# Patient Record
Sex: Male | Born: 1965 | Race: Black or African American | Hispanic: No | Marital: Married | Smoking: Never smoker
Health system: Southern US, Community
[De-identification: ages and names within clinical notes are randomized; demographics above are authoritative.]

## PROBLEM LIST (undated history)

## (undated) DIAGNOSIS — M109 Gout, unspecified: Secondary | ICD-10-CM

## (undated) DIAGNOSIS — I10 Essential (primary) hypertension: Secondary | ICD-10-CM

## (undated) HISTORY — PX: ARCUATE KERATECTOMY: SHX212

## (undated) HISTORY — PX: NECK SURGERY: SHX720

## (undated) HISTORY — PX: BACK SURGERY: SHX140

## (undated) HISTORY — PX: HERNIA REPAIR: SHX51

## (undated) HISTORY — PX: FRACTURE SURGERY: SHX138

---

## 2000-10-25 ENCOUNTER — Encounter: Admission: RE | Admit: 2000-10-25 | Discharge: 2000-10-25 | Payer: Self-pay | Admitting: Neurosurgery

## 2000-10-25 ENCOUNTER — Encounter: Payer: Self-pay | Admitting: Neurosurgery

## 2004-12-01 ENCOUNTER — Ambulatory Visit (HOSPITAL_COMMUNITY): Admission: RE | Admit: 2004-12-01 | Discharge: 2004-12-02 | Payer: Self-pay | Admitting: Neurosurgery

## 2005-01-02 ENCOUNTER — Encounter: Admission: RE | Admit: 2005-01-02 | Discharge: 2005-01-02 | Payer: Self-pay | Admitting: Neurosurgery

## 2005-02-20 ENCOUNTER — Encounter: Admission: RE | Admit: 2005-02-20 | Discharge: 2005-02-20 | Payer: Self-pay | Admitting: Neurosurgery

## 2005-04-06 ENCOUNTER — Encounter: Admission: RE | Admit: 2005-04-06 | Discharge: 2005-04-06 | Payer: Self-pay | Admitting: Neurosurgery

## 2005-09-21 ENCOUNTER — Encounter: Admission: RE | Admit: 2005-09-21 | Discharge: 2005-09-21 | Payer: Self-pay | Admitting: Neurosurgery

## 2006-06-06 ENCOUNTER — Encounter: Admission: RE | Admit: 2006-06-06 | Discharge: 2006-07-30 | Payer: Self-pay | Admitting: Neurosurgery

## 2012-08-24 ENCOUNTER — Emergency Department (HOSPITAL_BASED_OUTPATIENT_CLINIC_OR_DEPARTMENT_OTHER)
Admission: EM | Admit: 2012-08-24 | Discharge: 2012-08-24 | Disposition: A | Discharge status: Home / Self Care | Payer: Self-pay | Attending: Emergency Medicine | Admitting: Emergency Medicine

## 2012-08-24 ENCOUNTER — Encounter (HOSPITAL_BASED_OUTPATIENT_CLINIC_OR_DEPARTMENT_OTHER): Payer: Self-pay | Admitting: *Deleted

## 2012-08-24 DIAGNOSIS — M5430 Sciatica, unspecified side: Secondary | ICD-10-CM | POA: Insufficient documentation

## 2012-08-24 DIAGNOSIS — M25559 Pain in unspecified hip: Secondary | ICD-10-CM | POA: Insufficient documentation

## 2012-08-24 DIAGNOSIS — R209 Unspecified disturbances of skin sensation: Secondary | ICD-10-CM | POA: Insufficient documentation

## 2012-08-24 DIAGNOSIS — I10 Essential (primary) hypertension: Secondary | ICD-10-CM | POA: Insufficient documentation

## 2012-08-24 DIAGNOSIS — Z79899 Other long term (current) drug therapy: Secondary | ICD-10-CM | POA: Insufficient documentation

## 2012-08-24 DIAGNOSIS — M5432 Sciatica, left side: Secondary | ICD-10-CM

## 2012-08-24 DIAGNOSIS — M62838 Other muscle spasm: Secondary | ICD-10-CM | POA: Insufficient documentation

## 2012-08-24 DIAGNOSIS — Z8781 Personal history of (healed) traumatic fracture: Secondary | ICD-10-CM | POA: Insufficient documentation

## 2012-08-24 HISTORY — DX: Essential (primary) hypertension: I10

## 2012-08-24 MED ORDER — NAPROXEN 250 MG PO TABS
500.0000 mg | ORAL_TABLET | Freq: Once | ORAL | Status: AC
  Administered 2012-08-24: 500 mg via ORAL
  Filled 2012-08-24: qty 2

## 2012-08-24 MED ORDER — NAPROXEN SODIUM 220 MG PO TABS: ORAL_TABLET | ORAL | Status: DC

## 2012-08-24 MED ORDER — OXYCODONE-ACETAMINOPHEN 5-325 MG PO TABS: 1.0000 | ORAL_TABLET | Freq: Four times a day (QID) | ORAL | Status: DC | PRN

## 2012-08-24 NOTE — ED Provider Notes (Signed)
History    This chart was scribed for Hanley Seamen, MD by Quintella Reichert, ED scribe.  This patient was seen in room MH05/MH05 and the patient's care was started at 11:24 PM.   CSN: [id number]  Arrival date & time 08/24/12  2038      Chief Complaint  Patient presents with  . Leg Pain     The history is provided by the patient. No language interpreter was used.    HPI Comments: Reginald Flores is a 47 y.o. male who presents to the Emergency Department complaining of constant, waxing-and-waning left-sided hip pain in the SI region that began 2 weeks ago.  The pain is characterized as being "like something piercing into my bone," and radiating into the left-sided groin and down the left leg, through about the L4 or L5 dermatome.  Pt also notes intermittent numbness and intermittent muscle spasms in the region.  Pt is ambulatory but with pain.  He notes that he has tried some OTC analgesics, with partial relief.  Pt denies groin numbness, urinary or bowel symptoms, nausea, emesis, or any other associated symptoms.  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 Severity is moderate to severe.   Past Medical History  Diagnosis Date  . Hypertension     Past Surgical History  Procedure Laterality Date  . Hernia repair    . Fracture surgery    . Back surgery      History reviewed. No pertinent family history.  History  Substance Use Topics  . Smoking status: Never Smoker   . Smokeless tobacco: Not on file  . Alcohol Use: No      Review of Systems A complete 10 system review of systems was obtained and all systems are negative except as noted in the HPI and PMH.    Allergies  Review of patient's allergies indicates no known allergies.  Home Medications   Current Outpatient Rx  Name  Route  Sig  Dispense  Refill  . hydrochlorothiazide (HYDRODIURIL) 25 MG tablet   Oral   Take 25 mg by mouth daily.           BP 141/82  Pulse 68   Temp(Src) 99.4 F (37.4 C) (Oral)  Resp 16  Ht 5' 5.5" (1.664 m)  Wt 168 lb (76.204 kg)  BMI 27.52 kg/m2  SpO2 100%  Physical Exam  Nursing note and vitals reviewed. General: Well-developed, well-nourished male in no acute distress; appearance consistent with age of record HENT: Atraumatic.  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 Eyes: pupils equal round and reactive to light; extraocular muscles intact Neck: supple Heart: regular rate and rhythm; no murmurs, rubs or gallops Lungs: clear to auscultation bilaterally Abdomen: soft; nondistended; nontender; no masses or hepatosplenomegaly; bowel sounds present Extremities: No deformity; full range of motion; pulses normal Minimal left sciatic notch tenderness No back tenderness Normal right straight leg raise Left straight leg raise pain on 45 degrees Neurologic: Awake, alert and oriented; motor function intact in all extremities and symmetric; no facial droop Skin: Warm and dry Psychiatric: Normal mood and affect     ED Course  Procedures (including critical care time)  DIAGNOSTIC STUDIES: Oxygen Saturation is 100% on room air, normal by my interpretation.    COORDINATION OF CARE: 11:29 PM-Informed pt that symptoms are likely due to sciatica.  Discussed treatment plan which includes pain medication and f/u with neurologist with pt at  bedside and pt agreed to plan.      MDM  I personally performed the services described in this documentation, which was scribed in my presence.  The recorded information has been reviewed and is accurate.       Hanley Seamen, MD 08/25/12 [phone]

## 2012-08-24 NOTE — ED Notes (Signed)
Left leg pain and "spasms" x 2 weeks

## 2012-08-24 NOTE — ED Notes (Signed)
MD at bedside. 

## 2012-08-24 NOTE — ED Notes (Signed)
Called x 1 for triage. Not in lobby restroom.

## 2012-09-11 ENCOUNTER — Emergency Department (HOSPITAL_BASED_OUTPATIENT_CLINIC_OR_DEPARTMENT_OTHER)
Admission: EM | Admit: 2012-09-11 | Discharge: 2012-09-11 | Disposition: A | Discharge status: Home / Self Care | Payer: Self-pay | Attending: Emergency Medicine | Admitting: Emergency Medicine

## 2012-09-11 ENCOUNTER — Encounter (HOSPITAL_BASED_OUTPATIENT_CLINIC_OR_DEPARTMENT_OTHER): Payer: Self-pay | Admitting: *Deleted

## 2012-09-11 DIAGNOSIS — I10 Essential (primary) hypertension: Secondary | ICD-10-CM | POA: Insufficient documentation

## 2012-09-11 DIAGNOSIS — K5641 Fecal impaction: Secondary | ICD-10-CM | POA: Insufficient documentation

## 2012-09-11 DIAGNOSIS — K6289 Other specified diseases of anus and rectum: Secondary | ICD-10-CM | POA: Insufficient documentation

## 2012-09-11 NOTE — ED Provider Notes (Signed)
History     CSN: [id number]  Arrival date & time 09/11/12  0865   First MD Initiated Contact with Patient 09/11/12 [phone]      Chief Complaint  Patient presents with  . Constipation    (Consider location/radiation/quality/duration/timing/severity/associated sxs/prior treatment) HPI Patient reports feeling constipated for 4 days. He feels a fullness in his rectum. He treated himself with an enema yesterday with success at having a bowel movement. He can felt constipated today used an enema this morning without success. He denies abdominal pain denies fever denies nausea vomiting denies other complaint. Patient recently treated with Percocet for back pain. Back pain has resolved. No other associated symptoms. Past Medical History  Diagnosis Date  . Hypertension     Past Surgical History  Procedure Laterality Date  . Hernia repair    . Fracture surgery    . Back surgery      History reviewed. No pertinent family history.  History  Substance Use Topics  . Smoking status: Never Smoker   . Smokeless tobacco: Not on file  . Alcohol Use: No      Review of Systems  Constitutional: Negative.   Gastrointestinal: Positive for constipation and rectal pain. Negative for abdominal pain, blood in stool and abdominal distention.       Rectal fullness    Allergies  Review of patient's allergies indicates no known allergies.  Home Medications   Current Outpatient Rx  Name  Route  Sig  Dispense  Refill  . hydrochlorothiazide (HYDRODIURIL) 25 MG tablet   Oral   Take 25 mg by mouth daily.         . naproxen sodium (ALEVE) 220 MG tablet      Take 2 tablets every 12 hours for back pain.         Marland Kitchen oxyCODONE-acetaminophen (PERCOCET) 5-325 MG per tablet   Oral   Take 1-2 tablets by mouth every 6 (six) hours as needed for pain.   30 tablet   0     BP 155/91  Pulse 86  Temp(Src) 98.6 F (37 C)  Resp 18  Ht 5' 5.5" (1.664 m)  Wt 170 lb (77.111 kg)  BMI 27.85 kg/m2   SpO2 100%  Physical Exam  Nursing note and vitals reviewed. Constitutional: He appears well-developed and well-nourished.  HENT:  Head: Normocephalic and atraumatic.  Eyes: Conjunctivae are normal. Pupils are equal, round, and reactive to light.  Neck: Neck supple. No tracheal deviation present. No thyromegaly present.  Cardiovascular: Normal rate and regular rhythm.   No murmur heard. Pulmonary/Chest: Effort normal and breath sounds normal.  Abdominal: Soft. Bowel sounds are normal. He exhibits no distension. There is no tenderness.  Genitourinary: Penis normal.  Rectum normal tone hard brown stool in vault no gross blood  Musculoskeletal: Normal range of motion. He exhibits no edema and no tenderness.  Neurological: He is alert. Coordination normal.  Skin: Skin is warm and dry. No rash noted.  Psychiatric: He has a normal mood and affect.    ED Course  Procedures (including critical care time)  Labs Reviewed - No data to display No results found.   No diagnosis found.  I loosened stool in rectal vault and performed digital disimpaction. Patient had successful bowel movement after disimpaction. At 10:50 AM he is asymptomatic  MDM  Plan followup with PMD as needed. Blood pressure recheck 3 weeks Diagnosis#1 fecal impaction #2 hypertension       Doug Sou, MD 09/11/12 [phone]

## 2012-09-11 NOTE — ED Notes (Signed)
Pt reports constipation since yesterday.  Reports that he hasnt had a normal BM in 4 days.  Pt used an enema yesterday with success.  States that he feels a lot of pressure as if he needs to use the bathroom but is unable.  Denies rectal bleeding.

## 2013-12-18 ENCOUNTER — Emergency Department (HOSPITAL_BASED_OUTPATIENT_CLINIC_OR_DEPARTMENT_OTHER): Payer: No Typology Code available for payment source

## 2013-12-18 ENCOUNTER — Encounter (HOSPITAL_BASED_OUTPATIENT_CLINIC_OR_DEPARTMENT_OTHER): Payer: Self-pay | Admitting: Emergency Medicine

## 2013-12-18 ENCOUNTER — Emergency Department (HOSPITAL_BASED_OUTPATIENT_CLINIC_OR_DEPARTMENT_OTHER)
Admission: EM | Admit: 2013-12-18 | Discharge: 2013-12-18 | Disposition: A | Discharge status: Home / Self Care | Payer: No Typology Code available for payment source | Attending: Emergency Medicine | Admitting: Emergency Medicine

## 2013-12-18 DIAGNOSIS — R209 Unspecified disturbances of skin sensation: Secondary | ICD-10-CM | POA: Diagnosis present

## 2013-12-18 DIAGNOSIS — Z791 Long term (current) use of non-steroidal anti-inflammatories (NSAID): Secondary | ICD-10-CM | POA: Insufficient documentation

## 2013-12-18 DIAGNOSIS — Z79899 Other long term (current) drug therapy: Secondary | ICD-10-CM | POA: Insufficient documentation

## 2013-12-18 DIAGNOSIS — I10 Essential (primary) hypertension: Secondary | ICD-10-CM | POA: Diagnosis not present

## 2013-12-18 DIAGNOSIS — M5412 Radiculopathy, cervical region: Secondary | ICD-10-CM | POA: Diagnosis not present

## 2013-12-18 DIAGNOSIS — IMO0002 Reserved for concepts with insufficient information to code with codable children: Secondary | ICD-10-CM | POA: Diagnosis not present

## 2013-12-18 DIAGNOSIS — M109 Gout, unspecified: Secondary | ICD-10-CM | POA: Diagnosis not present

## 2013-12-18 HISTORY — DX: Gout, unspecified: M10.9

## 2013-12-18 MED ORDER — HYDROCODONE-ACETAMINOPHEN 5-325 MG PO TABS: 1.0000 | ORAL_TABLET | ORAL | Status: DC | PRN

## 2013-12-18 MED ORDER — PREDNISONE (PAK) 10 MG PO TABS: ORAL_TABLET | ORAL | Status: DC

## 2013-12-18 NOTE — ED Provider Notes (Signed)
CSN: [id number]     Arrival date & time 12/18/13  1621 History   First MD Initiated Contact with Patient 12/18/13 1632     Chief Complaint  Patient presents with  . Numbness    HPI Patient presents emergent complaints of intermittent numbness and tingling into his left arm for the last 3 weeks. Patient states he's had some sharp pain in his left shoulder down towards his hand. He is also noticed some numbness and tingling in the arm and hand primarily in his fifth fourth and third digits. He does work with his arms a lot. He has not noticed any trouble with weakness or numbness in his legs. He has not had any difficulty with his speech. He denies headache. He denies trouble with his coordination.  No facial numbness or weakness. Past Medical History  Diagnosis Date  . Hypertension   . Gout    Past Surgical History  Procedure Laterality Date  . Hernia repair    . Fracture surgery    . Back surgery    . Neck surgery    . Arcuate keratectomy     No family history on file. History  Substance Use Topics  . Smoking status: Never Smoker   . Smokeless tobacco: Not on file  . Alcohol Use: No    Review of Systems  All other systems reviewed and are negative.     Allergies  Review of patient's allergies indicates no known allergies.  Home Medications   Prior to Admission medications   Medication Sig Start Date End Date Taking? Authorizing Provider  indomethacin (INDOCIN) 25 MG capsule Take 25 mg by mouth 2 (two) times daily with a meal.   Yes Historical Provider, MD  lisinopril (PRINIVIL,ZESTRIL) 10 MG tablet Take 25 mg by mouth daily.   Yes Historical Provider, MD  hydrochlorothiazide (HYDRODIURIL) 25 MG tablet Take 25 mg by mouth daily.    Historical Provider, MD  HYDROcodone-acetaminophen (NORCO/VICODIN) 5-325 MG per tablet Take 1-2 tablets by mouth every 4 (four) hours as needed. 12/18/13   Linwood Dibbles, MD  naproxen sodium (ALEVE) 220 MG tablet Take 2 tablets every 12 hours for  back pain. 08/24/12   Carlisle Beers Molpus, MD  oxyCODONE-acetaminophen (PERCOCET) 5-325 MG per tablet Take 1-2 tablets by mouth every 6 (six) hours as needed for pain. 08/24/12   John L Molpus, MD  predniSONE (STERAPRED UNI-PAK) 10 MG tablet Take 6 tabs by mouth daily  for 2 days, then 5 tabs for 2 days, then 4 tabs for 2 days, then 3 tabs for 2 days, 2 tabs for 2 days, then 1 tab by mouth daily for 2 days 12/18/13   Linwood Dibbles, MD   BP 137/85  Pulse 50  Temp(Src) 98.1 F (36.7 C) (Oral)  Resp 20  Ht 5' 5.5" (1.664 m)  Wt 170 lb (77.111 kg)  BMI 27.85 kg/m2  SpO2 99% Physical Exam  Nursing note and vitals reviewed. Constitutional: He is oriented to person, place, and time. He appears well-developed and well-nourished. No distress.  HENT:  Head: Normocephalic and atraumatic.  Right Ear: External ear normal.  Left Ear: External ear normal.  Mouth/Throat: Oropharynx is clear and moist.  Eyes: Conjunctivae are normal. Right eye exhibits no discharge. Left eye exhibits no discharge. No scleral icterus.  Neck: Neck supple. No tracheal deviation present.  Cardiovascular: Normal rate, regular rhythm and intact distal pulses.   Pulmonary/Chest: Effort normal and breath sounds normal. No stridor. No respiratory distress. He has  no wheezes. He has no rales.  Abdominal: Soft. Bowel sounds are normal. He exhibits no distension. There is no tenderness. There is no rebound and no guarding.  Musculoskeletal: He exhibits no edema and no tenderness.  Neurological: He is alert and oriented to person, place, and time. He has normal strength. No cranial nerve deficit (No facial droop, extraocular movements intact, tongue midline ) or sensory deficit. He exhibits normal muscle tone. He displays no seizure activity. Coordination normal.  No pronator drift bilateral upper extrem, able to hold both legs off bed for 5 seconds, sensation intact in all extremities, no visual field cuts, no left or right sided neglect, normal  finger-nose exam bilaterally, no nystagmus noted, subjective decreased sensation fifth fourth and third digits of the left hand   Skin: Skin is warm and dry. No rash noted.  Psychiatric: He has a normal mood and affect.    ED Course  Procedures (including critical care time) Labs Review Labs Reviewed - No data to display  Imaging Review Dg Cervical Spine Complete  12/18/2013   CLINICAL DATA:  48 year old male. Left arm hand and finger numbness for several days. History of previous cervical fusion. No acute injury reported.  EXAM: CERVICAL SPINE  4+ VIEWS  COMPARISON:  Plain film 02/20/2005, 01/02/2005  FINDINGS: Frontal, lateral, lateral swimmer's view, open-mouth odontoid view and bilateral oblique views of the cervical spine submitted.  Again demonstrated are surgical changes of anterior cervical discectomy and fusion with anterior plate screw fixation of C5-C6-C7. No perihardware lucency or acute fracture. The anterior plate remains approximated with the anterior surface of the bones. Again there is complete fusion across the disc space at the intervening levels.  No acute bony abnormality is identified.  Similar appearance of mild disc space loss at C4-C5.  No significant encroachment upon the neural foramina on the oblique views. No significant facet hypertrophy.  Lateral atlantodental distance is symmetric with alignment a lateral mass of C1-C2.  Anatomic alignment of the cervical elements maintained from C1-T1.  Visualized aspects of the thorax unremarkable.  IMPRESSION: No acute fracture or malalignment of the cervical spine.  Re- demonstration of surgical changes of anterior cervical discectomy and fusion with anterior plate and screw fixation of C5-C7 without complicating feature.  Similar appearance of the mild disc space narrowing of C4-C5.  Signed,  Yvone Neu. Loreta Ave, DO  [facility]  [facility]   Electronically Signed   By: Gilmer Mor O.D.    On: 12/18/2013 17:24   Dg Shoulder Left  12/18/2013   CLINICAL DATA:  Numbness  EXAM: LEFT SHOULDER - 2+ VIEW  COMPARISON:  None.  FINDINGS: There is no evidence of fracture or dislocation. There is no evidence of arthropathy or other focal bone abnormality. Soft tissues are unremarkable.  IMPRESSION: No acute osseous injury of the left shoulder.   Electronically Signed   By: Elige Ko   On: 12/18/2013 17:23     EKG Interpretation None      MDM   Final diagnoses:  Cervical radiculopathy    Patient's symptoms are in a C8 dermatomal pattern.  I suspect cervical radiculopathy. I doubt stroke or TIA. Patient has had prior spine surgery. Plan to discharge home on steroids and pain medications. Followup with his spine  doctor.    Linwood Dibbles, MD 12/18/13 [phone]

## 2013-12-18 NOTE — ED Notes (Signed)
MD at bedside. 

## 2013-12-18 NOTE — ED Notes (Signed)
Pt. Reports numbness tingling and tightness for the last 3 wks in his Left arm and L hand and L fingers.  Pt. Reports he works in a [employer]. Work related.

## 2013-12-18 NOTE — Discharge Instructions (Signed)

## 2016-03-27 ENCOUNTER — Emergency Department (HOSPITAL_COMMUNITY)
Admission: EM | Admit: 2016-03-27 | Discharge: 2016-03-27 | Disposition: A | Discharge status: Home / Self Care | Payer: BLUE CROSS/BLUE SHIELD | Attending: Emergency Medicine | Admitting: Emergency Medicine

## 2016-03-27 ENCOUNTER — Encounter (HOSPITAL_COMMUNITY): Payer: Self-pay | Admitting: Emergency Medicine

## 2016-03-27 DIAGNOSIS — R55 Syncope and collapse: Secondary | ICD-10-CM

## 2016-03-27 DIAGNOSIS — Y999 Unspecified external cause status: Secondary | ICD-10-CM | POA: Diagnosis not present

## 2016-03-27 DIAGNOSIS — Y929 Unspecified place or not applicable: Secondary | ICD-10-CM | POA: Diagnosis not present

## 2016-03-27 DIAGNOSIS — Z79899 Other long term (current) drug therapy: Secondary | ICD-10-CM | POA: Diagnosis not present

## 2016-03-27 DIAGNOSIS — I10 Essential (primary) hypertension: Secondary | ICD-10-CM | POA: Insufficient documentation

## 2016-03-27 DIAGNOSIS — R197 Diarrhea, unspecified: Secondary | ICD-10-CM | POA: Insufficient documentation

## 2016-03-27 DIAGNOSIS — S0011XA Contusion of right eyelid and periocular area, initial encounter: Secondary | ICD-10-CM | POA: Diagnosis not present

## 2016-03-27 DIAGNOSIS — X58XXXA Exposure to other specified factors, initial encounter: Secondary | ICD-10-CM | POA: Diagnosis not present

## 2016-03-27 DIAGNOSIS — Y939 Activity, unspecified: Secondary | ICD-10-CM | POA: Diagnosis not present

## 2016-03-27 DIAGNOSIS — S0993XA Unspecified injury of face, initial encounter: Secondary | ICD-10-CM | POA: Diagnosis present

## 2016-03-27 DIAGNOSIS — R112 Nausea with vomiting, unspecified: Secondary | ICD-10-CM | POA: Diagnosis not present

## 2016-03-27 LAB — CBC
HEMATOCRIT: 41.9 % (ref 39.0–52.0)
HEMOGLOBIN: 14.9 g/dL (ref 13.0–17.0)
MCH: 31.7 pg (ref 26.0–34.0)
MCHC: 35.6 g/dL (ref 30.0–36.0)
MCV: 89.1 fL (ref 78.0–100.0)
Platelets: 137 10*3/uL — ABNORMAL LOW (ref 150–400)
RBC: 4.7 MIL/uL (ref 4.22–5.81)
RDW: 13.3 % (ref 11.5–15.5)
WBC: 14.8 10*3/uL — AB (ref 4.0–10.5)

## 2016-03-27 LAB — URINALYSIS, ROUTINE W REFLEX MICROSCOPIC
BILIRUBIN URINE: NEGATIVE
Bacteria, UA: NONE SEEN
Glucose, UA: NEGATIVE mg/dL
KETONES UR: NEGATIVE mg/dL
LEUKOCYTES UA: NEGATIVE
Nitrite: NEGATIVE
PH: 5 (ref 5.0–8.0)
PROTEIN: NEGATIVE mg/dL
SQUAMOUS EPITHELIAL / LPF: NONE SEEN
Specific Gravity, Urine: 1.011 (ref 1.005–1.030)

## 2016-03-27 LAB — BASIC METABOLIC PANEL
ANION GAP: 11 (ref 5–15)
BUN: 13 mg/dL (ref 6–20)
CHLORIDE: 103 mmol/L (ref 101–111)
CO2: 25 mmol/L (ref 22–32)
Calcium: 9.1 mg/dL (ref 8.9–10.3)
Creatinine, Ser: 1.34 mg/dL — ABNORMAL HIGH (ref 0.61–1.24)
GFR calc non Af Amer: >60 mL/min (ref >60)
Glucose, Bld: 112 mg/dL — ABNORMAL HIGH (ref 65–99)
POTASSIUM: 3.7 mmol/L (ref 3.5–5.1)
SODIUM: 139 mmol/L (ref 135–145)

## 2016-03-27 LAB — HEPATIC FUNCTION PANEL
ALBUMIN: 4.3 g/dL (ref 3.5–5.0)
ALK PHOS: 52 U/L (ref 38–126)
ALT: 21 U/L (ref 17–63)
AST: 28 U/L (ref 15–41)
BILIRUBIN TOTAL: 0.8 mg/dL (ref 0.3–1.2)
Bilirubin, Direct: <0.1 mg/dL — ABNORMAL LOW (ref 0.1–0.5)
TOTAL PROTEIN: 7.5 g/dL (ref 6.5–8.1)

## 2016-03-27 LAB — CBG MONITORING, ED: Glucose-Capillary: 117 mg/dL — ABNORMAL HIGH (ref 65–99)

## 2016-03-27 MED ORDER — SODIUM CHLORIDE 0.9 % IV BOLUS (SEPSIS)
1000.0000 mL | Freq: Once | INTRAVENOUS | Status: AC
  Administered 2016-03-27: 1000 mL via INTRAVENOUS

## 2016-03-27 MED ORDER — ONDANSETRON HCL 4 MG/2ML IJ SOLN
4.0000 mg | Freq: Once | INTRAMUSCULAR | Status: AC
  Administered 2016-03-27: 4 mg via INTRAVENOUS
  Filled 2016-03-27: qty 2

## 2016-03-27 MED ORDER — ONDANSETRON HCL 4 MG PO TABS: 4.0000 mg | ORAL_TABLET | Freq: Four times a day (QID) | ORAL | 0 refills | Status: AC

## 2016-03-27 NOTE — Discharge Instructions (Addendum)
Drink plenty of fluids Take Zofran as needed for vomiting Take Ibuprofen or Tylenol for pain Follow up with your family doctor

## 2016-03-27 NOTE — ED Notes (Signed)
EDP at bedside  

## 2016-03-27 NOTE — Discharge Planning (Signed)
Pt up for discharge. EDCM reviewed chart for possible CM needs.  No needs identified or communicated.  

## 2016-03-27 NOTE — ED Notes (Signed)
Pt states that he passed out while using the bathroom and fell forward hitting his head. Complaints of right head and neck pain at this time.

## 2016-03-27 NOTE — ED Triage Notes (Signed)
Pt states he started having n/v/d this morning that woke him up and he pass out twice PTA to ED.

## 2016-03-27 NOTE — ED Provider Notes (Signed)
[facility]-EMERGENCY DEPT Provider Note   CSN: [id number] Arrival date & time: 03/27/16  0530     History   Chief Complaint Chief Complaint  Patient presents with  . Loss of Consciousness  . Nausea  . Emesis  . Diarrhea    HPI Reginald Flores is a 50 y.o. male who presents with syncopal episode x 2 and N/V/D. PMH significant for gout and HTN. He states that he got home from work yesterday and was feeling ok. He started to feel bad after he was going to sleep. He woke up and felt like he had to go to the bathroom because he was having cramping and bloating. He went to have a BM and started vomiting instead. He then started having diarrhea and abdominal cramping. After vomiting he felt nauseous and passed out and believes he hit his head on the floor. When he regained consciousness he felt weak and crawled to the bedroom. He then got up and felt like he was going to vomit again. He went back to the bathroom and had more vomiting and diarrhea. He reports associated chills. No sick contacts. No fever, headache, chest pain, SOB, cough, urinary symptoms. He states his abdominal pain has resolved. Reports some head and left sided neck pain from when he hit his head. No extremity weakness. He is not on blood thinners.  HPI  Past Medical History:  Diagnosis Date  . Gout   . Hypertension     There are no active problems to display for this patient.   Past Surgical History:  Procedure Laterality Date  . ARCUATE KERATECTOMY    . BACK SURGERY    . FRACTURE SURGERY    . HERNIA REPAIR    . NECK SURGERY         Home Medications    Prior to Admission medications   Medication Sig Start Date End Date Taking? Authorizing Provider  hydrochlorothiazide (HYDRODIURIL) 25 MG tablet Take 25 mg by mouth daily.    Historical Provider, MD  HYDROcodone-acetaminophen (NORCO/VICODIN) 5-325 MG per tablet Take 1-2 tablets by mouth every 4 (four) hours as needed. 12/18/13   Linwood DibblesJon Knapp, MD  indomethacin  (INDOCIN) 25 MG capsule Take 25 mg by mouth 2 (two) times daily with a meal.    Historical Provider, MD  lisinopril (PRINIVIL,ZESTRIL) 10 MG tablet Take 25 mg by mouth daily.    Historical Provider, MD  naproxen sodium (ALEVE) 220 MG tablet Take 2 tablets every 12 hours for back pain. 08/24/12   John Molpus, MD  oxyCODONE-acetaminophen (PERCOCET) 5-325 MG per tablet Take 1-2 tablets by mouth every 6 (six) hours as needed for pain. 08/24/12   John Molpus, MD  predniSONE (STERAPRED UNI-PAK) 10 MG tablet Take 6 tabs by mouth daily  for 2 days, then 5 tabs for 2 days, then 4 tabs for 2 days, then 3 tabs for 2 days, 2 tabs for 2 days, then 1 tab by mouth daily for 2 days 12/18/13   Linwood DibblesJon Knapp, MD    Family History History reviewed. No pertinent family history.  Social History Social History  Substance Use Topics  . Smoking status: Never Smoker  . Smokeless tobacco: Not on file  . Alcohol use No     Allergies   Patient has no known allergies.   Review of Systems Review of Systems  Constitutional: Positive for chills. Negative for fever.  Respiratory: Negative for cough and shortness of breath.   Cardiovascular: Negative for chest pain.  Gastrointestinal:  Positive for abdominal pain (resolved), diarrhea, nausea and vomiting. Negative for blood in stool.  Genitourinary: Negative for dysuria.  Musculoskeletal: Positive for neck pain.  Neurological: Positive for syncope and weakness (generalized). Negative for light-headedness and headaches.  All other systems reviewed and are negative.    Physical Exam Updated Vital Signs BP 135/74   Pulse 76   Temp 98.1 F (36.7 C)   Resp 23   Ht 5' 5.5" (1.664 m)   Wt 71.7 kg   SpO2 98%   BMI 25.89 kg/m   Physical Exam  Constitutional: He is oriented to person, place, and time. He appears well-developed and well-nourished. No distress.  HENT:  Head: Normocephalic.  Small area of ecchymosis over right eyebrow with minor tenderness  Eyes:  Conjunctivae are normal. Pupils are equal, round, and reactive to light. Right eye exhibits no discharge. Left eye exhibits no discharge. No scleral icterus.  Neck: Normal range of motion.  Mild left sided paracervical and trapezius tenderness  Cardiovascular: Normal rate and regular rhythm.  Exam reveals no gallop and no friction rub.   No murmur heard. No carotid bruits  Pulmonary/Chest: Effort normal and breath sounds normal. No respiratory distress. He has no wheezes. He has no rales. He exhibits no tenderness.  Abdominal: Soft. Bowel sounds are normal. He exhibits no distension and no mass. There is tenderness (mild generalized). There is no rebound and no guarding. No hernia.  Neurological: He is alert and oriented to person, place, and time.  Lying on stretcher in NAD. GCS 15. Speaks in a clear voice. Cranial nerves II through XII grossly intact. 5/5 strength in all extremities. Sensation fully intact.    Skin: Skin is warm and dry.  Psychiatric: He has a normal mood and affect. His behavior is normal.  Nursing note and vitals reviewed.    ED Treatments / Results  Labs (all labs ordered are listed, but only abnormal results are displayed) Labs Reviewed  CBC - Abnormal; Notable for the following:       Result Value   WBC 14.8 (*)    Platelets 137 (*)    All other components within normal limits  URINALYSIS, ROUTINE W REFLEX MICROSCOPIC - Abnormal; Notable for the following:    Hgb urine dipstick SMALL (*)    All other components within normal limits  CBG MONITORING, ED - Abnormal; Notable for the following:    Glucose-Capillary 117 (*)    All other components within normal limits  BASIC METABOLIC PANEL  HEPATIC FUNCTION PANEL    EKG  EKG Interpretation  Date/Time:  Tuesday March 27 2016 05:39:18 EST Ventricular Rate:  70 PR Interval:    QRS Duration: 83 QT Interval:  366 QTC Calculation: 395 R Axis:   52 Text Interpretation:  Sinus rhythm Prolonged PR interval  Baseline wander in lead(s) V6 Confirmed by Wilkie Aye  MD, Toni Amend ([id number]) on 03/27/2016 5:56:33 AM Also confirmed by Wilkie Aye  MD, COURTNEY ([id number]), editor [place] CT, Jola Babinski [phone])  on 03/27/2016 7:05:42 AM       Radiology No results found.  Procedures Procedures (including critical care time)  Medications Ordered in ED Medications  sodium chloride 0.9 % bolus 1,000 mL (0 mLs Intravenous Stopped 03/27/16 0655)  ondansetron (ZOFRAN) injection 4 mg (4 mg Intravenous Given 03/27/16 0607)    Initial Impression / Assessment and Plan / ED Course  I have reviewed the triage vital signs and the nursing notes.  Pertinent labs & imaging results that were available during my  care of the patient were reviewed by me and considered in my medical decision making (see chart for details).  Clinical Course    50 year old male with N/V/D likely from viral gastroenteritis. Syncopal episode sounds vasovagal from having multiple bouts of emesis and diarrhea. He has a minor head injury and minimal neck pain which is not midline. Patient is afebrile, not tachycardic or tachypneic, normotensive, and not hypoxic. EKG is SR with prolonged PR. No events on cardiac monitor. CBC remarkable for leukocytosis of 14.8. CMP remarkable for SCr of 1.34. On review of EMR this appears to be elevated from prior value of 1.1 in Jan 2017 [facility]. UA clean. Abdomen is soft, benign, non-tender. Do not feel further imaging is warranted currently.   2L IVF given along with Zofran. Patient reports symptomatic improvement. PO challenge tolerated well. Will give rx of Zofran and have him follow up with PCP.  Final Clinical Impressions(s) / ED Diagnoses   Final diagnoses:  Nausea vomiting and diarrhea  Syncope, unspecified syncope type    New Prescriptions New Prescriptions   ONDANSETRON (ZOFRAN) 4 MG TABLET    Take 1 tablet (4 mg total) by mouth every 6 (six) hours.     Bethel BornKelly Marie Sarie Stall, PA-C 03/27/16 1005      Derwood KaplanAnkit Nanavati, MD 03/27/16 2329

## 2017-08-01 ENCOUNTER — Emergency Department (HOSPITAL_BASED_OUTPATIENT_CLINIC_OR_DEPARTMENT_OTHER)
Admission: EM | Admit: 2017-08-01 | Discharge: 2017-08-01 | Disposition: A | Discharge status: Home / Self Care | Payer: BLUE CROSS/BLUE SHIELD | Attending: Emergency Medicine | Admitting: Emergency Medicine

## 2017-08-01 ENCOUNTER — Encounter (HOSPITAL_BASED_OUTPATIENT_CLINIC_OR_DEPARTMENT_OTHER): Payer: Self-pay

## 2017-08-01 DIAGNOSIS — Z79899 Other long term (current) drug therapy: Secondary | ICD-10-CM | POA: Insufficient documentation

## 2017-08-01 DIAGNOSIS — Y929 Unspecified place or not applicable: Secondary | ICD-10-CM | POA: Diagnosis not present

## 2017-08-01 DIAGNOSIS — Y9389 Activity, other specified: Secondary | ICD-10-CM | POA: Diagnosis not present

## 2017-08-01 DIAGNOSIS — S0501XA Injury of conjunctiva and corneal abrasion without foreign body, right eye, initial encounter: Secondary | ICD-10-CM | POA: Insufficient documentation

## 2017-08-01 DIAGNOSIS — W228XXA Striking against or struck by other objects, initial encounter: Secondary | ICD-10-CM | POA: Insufficient documentation

## 2017-08-01 DIAGNOSIS — I10 Essential (primary) hypertension: Secondary | ICD-10-CM | POA: Insufficient documentation

## 2017-08-01 DIAGNOSIS — Y999 Unspecified external cause status: Secondary | ICD-10-CM | POA: Insufficient documentation

## 2017-08-01 DIAGNOSIS — S0591XA Unspecified injury of right eye and orbit, initial encounter: Secondary | ICD-10-CM | POA: Diagnosis present

## 2017-08-01 MED ORDER — TETRACAINE HCL 0.5 % OP SOLN
OPHTHALMIC | Status: AC
  Filled 2017-08-01: qty 4

## 2017-08-01 MED ORDER — HYDROCODONE-ACETAMINOPHEN 5-325 MG PO TABS: 1.0000 | ORAL_TABLET | Freq: Four times a day (QID) | ORAL | 0 refills | Status: AC | PRN

## 2017-08-01 MED ORDER — FLUORESCEIN SODIUM 1 MG OP STRP
ORAL_STRIP | OPHTHALMIC | Status: AC
  Administered 2017-08-01: 1
  Filled 2017-08-01: qty 1

## 2017-08-01 MED ORDER — ERYTHROMYCIN 5 MG/GM OP OINT
TOPICAL_OINTMENT | Freq: Four times a day (QID) | OPHTHALMIC | Status: DC
  Administered 2017-08-01: via OPHTHALMIC
  Filled 2017-08-01: qty 3.5

## 2017-08-01 MED ORDER — HYDROCODONE-ACETAMINOPHEN 5-325 MG PO TABS: 1.0000 | ORAL_TABLET | Freq: Four times a day (QID) | ORAL | 0 refills | Status: DC | PRN

## 2017-08-01 NOTE — ED Provider Notes (Signed)
[facility] Provider Note: Reginald DellJ. Lane Javian Nudd, MD, FACEP  CSN: [id number] MRN: [id number] ARRIVAL: 08/01/17 at 2234 ROOM: [facility]10/[facility]10   CHIEF COMPLAINT  Eye Injury   HISTORY OF PRESENT ILLNESS  08/01/17 11:13 PM Reginald Flores is a 52 y.o. male who was breaking up some sticks to put them in the trash this morning about 11 AM.  He leaned down and poked his right eye with a stick.  He has had moderate to severe pain in the right eye since.  The pain is sharp and intermittent.  His vision is intermittently blurred as well.  Nothing makes the pain better or worse.  [facility] [place] [facility] reveals the patient has received no opioid prescriptions in the past 2 years.   Past Medical History:  Diagnosis Date  . Gout   . Hypertension     Past Surgical History:  Procedure Laterality Date  . ARCUATE KERATECTOMY    . BACK SURGERY    . FRACTURE SURGERY    . HERNIA REPAIR    . NECK SURGERY      No family history on file.  Social History   Tobacco Use  . Smoking status: Never Smoker  . Smokeless tobacco: Never Used  Substance Use Topics  . Alcohol use: No  . Drug use: No    Prior to Admission medications   Medication Sig Start Date End Date Taking? Authorizing Provider  aspirin-sod bicarb-citric acid (ALKA-SELTZER) 325 MG TBEF tablet Take 325 mg by mouth every 6 (six) hours as needed.    [provider]  hydrochlorothiazide (HYDRODIURIL) 25 MG tablet Take 25 mg by mouth daily.    [provider]  indomethacin (INDOCIN) 25 MG capsule Take 25 mg by mouth 2 (two) times daily as needed for mild pain.     [provider]  ondansetron (ZOFRAN) 4 MG tablet Take 1 tablet (4 mg total) by mouth every 6 (six) hours. 03/27/16   Bethel BornGekas, Kelly Marie, PA-C    Allergies Patient has no known allergies.   REVIEW OF SYSTEMS  Negative except as noted here or in the History of Present Illness.   PHYSICAL  EXAMINATION  Initial Vital Signs Blood pressure (!) 154/68, pulse (!) 54, temperature 98.2 F (36.8 C), temperature source Oral, resp. rate 18, height 5\' 5"  (1.651 m), weight 67.6 kg (149 lb 0.5 oz), SpO2 99 %.  Examination General: Well-developed, well-nourished male in no acute distress; appearance consistent with age of record HENT: normocephalic Eyes: pupils equal, round and reactive to light; extraocular muscles intact; right upper corneal abrasion with fluorescein uptake Neck: supple Heart: regular rate and rhythm Lungs: clear to auscultation bilaterally Abdomen: soft; nondistended; nontender; bowel sounds present Extremities: No deformity; full range of motion Neurologic: Awake, alert and oriented; motor function intact in all extremities and symmetric; no facial droop Skin: Warm and dry Psychiatric: Normal mood and affect   RESULTS  Summary of this visit's results, reviewed by myself:   EKG Interpretation  Date/Time:    Ventricular Rate:    PR Interval:    QRS Duration:   QT Interval:    QTC Calculation:   R Axis:     Text Interpretation:        Laboratory Studies: No results found for this or any previous visit (from the past 24 hour(s)). Imaging Studies: No results found.  ED COURSE and MDM  Nursing notes and initial vitals signs, including pulse oximetry, reviewed.  Vitals:  08/01/17 2242  BP: (!) 154/68  Pulse: (!) 54  Resp: 18  Temp: 98.2 F (36.8 C)  TempSrc: Oral  SpO2: 99%  Weight: 67.6 kg (149 lb 0.5 oz)  Height: 5\' 5"  (1.651 m)   We will treat with topical erythromycin ointment analgesics.  I anticipate complete healing as the abrasion appears superficial.  PROCEDURES    ED DIAGNOSES     ICD-10-CM   1. Abrasion of right cornea, initial encounter S05.[name], MD 08/01/17 2324

## 2017-08-01 NOTE — ED Triage Notes (Signed)
Pt states he was doing yardwork approx 11am-right eye struck with stick-NAD-steady gait

## 2020-01-19 ENCOUNTER — Encounter (HOSPITAL_BASED_OUTPATIENT_CLINIC_OR_DEPARTMENT_OTHER): Payer: Self-pay | Admitting: *Deleted

## 2020-01-19 ENCOUNTER — Emergency Department (HOSPITAL_BASED_OUTPATIENT_CLINIC_OR_DEPARTMENT_OTHER)
Admission: EM | Admit: 2020-01-19 | Discharge: 2020-01-19 | Disposition: A | Discharge status: Home / Self Care | Payer: Managed Care, Other (non HMO) | Attending: Emergency Medicine | Admitting: Emergency Medicine

## 2020-01-19 ENCOUNTER — Other Ambulatory Visit: Payer: Self-pay

## 2020-01-19 ENCOUNTER — Emergency Department (HOSPITAL_BASED_OUTPATIENT_CLINIC_OR_DEPARTMENT_OTHER): Payer: Managed Care, Other (non HMO)

## 2020-01-19 DIAGNOSIS — R519 Headache, unspecified: Secondary | ICD-10-CM | POA: Insufficient documentation

## 2020-01-19 DIAGNOSIS — Z7982 Long term (current) use of aspirin: Secondary | ICD-10-CM | POA: Insufficient documentation

## 2020-01-19 DIAGNOSIS — I10 Essential (primary) hypertension: Secondary | ICD-10-CM | POA: Diagnosis not present

## 2020-01-19 DIAGNOSIS — Z79899 Other long term (current) drug therapy: Secondary | ICD-10-CM | POA: Insufficient documentation

## 2020-01-19 DIAGNOSIS — M79622 Pain in left upper arm: Secondary | ICD-10-CM | POA: Insufficient documentation

## 2020-01-19 DIAGNOSIS — M79602 Pain in left arm: Secondary | ICD-10-CM

## 2020-01-19 DIAGNOSIS — M5410 Radiculopathy, site unspecified: Secondary | ICD-10-CM | POA: Diagnosis not present

## 2020-01-19 LAB — BASIC METABOLIC PANEL
Anion gap: 10 (ref 5–15)
BUN: 15 mg/dL (ref 6–20)
CO2: 27 mmol/L (ref 22–32)
Calcium: 9.1 mg/dL (ref 8.9–10.3)
Chloride: 99 mmol/L (ref 98–111)
Creatinine, Ser: 1.1 mg/dL (ref 0.61–1.24)
GFR, Estimated: >60 mL/min (ref >60)
Glucose, Bld: 105 mg/dL — ABNORMAL HIGH (ref 70–99)
Potassium: 3.7 mmol/L (ref 3.5–5.1)
Sodium: 136 mmol/L (ref 135–145)

## 2020-01-19 LAB — CBC WITH DIFFERENTIAL/PLATELET
Abs Immature Granulocytes: 0.01 10*3/uL (ref 0.00–0.07)
Basophils Absolute: 0 10*3/uL (ref 0.0–0.1)
Basophils Relative: 0 %
Eosinophils Absolute: 0.1 10*3/uL (ref 0.0–0.5)
Eosinophils Relative: 2 %
HCT: 41.3 % (ref 39.0–52.0)
Hemoglobin: 14.2 g/dL (ref 13.0–17.0)
Immature Granulocytes: 0 %
Lymphocytes Relative: 30 %
Lymphs Abs: 1.6 10*3/uL (ref 0.7–4.0)
MCH: 30.6 pg (ref 26.0–34.0)
MCHC: 34.4 g/dL (ref 30.0–36.0)
MCV: 89 fL (ref 80.0–100.0)
Monocytes Absolute: 0.4 10*3/uL (ref 0.1–1.0)
Monocytes Relative: 8 %
Neutro Abs: 3.1 10*3/uL (ref 1.7–7.7)
Neutrophils Relative %: 60 %
Platelets: 155 10*3/uL (ref 150–400)
RBC: 4.64 MIL/uL (ref 4.22–5.81)
RDW: 13.3 % (ref 11.5–15.5)
WBC: 5.2 10*3/uL (ref 4.0–10.5)
nRBC: 0 % (ref 0.0–0.2)

## 2020-01-19 LAB — TROPONIN I (HIGH SENSITIVITY): Troponin I (High Sensitivity): 5 ng/L (ref <18)

## 2020-01-19 MED ORDER — METHYLPREDNISOLONE 4 MG PO TBPK: ORAL_TABLET | ORAL | 0 refills | Status: AC

## 2020-01-19 NOTE — ED Provider Notes (Signed)
[facility] EMERGENCY DEPARTMENT Provider Note   CSN: [id number] Arrival date & time: 01/19/20  1631     History Chief Complaint  Patient presents with  . Arm Pain    Reginald Flores is a 54 y.o. male.  The history is provided by the patient.  Arm Pain This is a new problem. The current episode started more than 2 days ago. The problem occurs daily. Progression since onset: waxing and waning. Pertinent negatives include no chest pain, no abdominal pain, no headaches and no shortness of breath. Nothing aggravates the symptoms. Nothing relieves the symptoms. He has tried nothing for the symptoms. The treatment provided no relief.       Past Medical History:  Diagnosis Date  . Gout   . Hypertension     There are no problems to display for this patient.   Past Surgical History:  Procedure Laterality Date  . ARCUATE KERATECTOMY    . BACK SURGERY    . FRACTURE SURGERY    . HERNIA REPAIR    . NECK SURGERY         No family history on file.  Social History   Tobacco Use  . Smoking status: Never Smoker  . Smokeless tobacco: Never Used  Substance Use Topics  . Alcohol use: No  . Drug use: No    Home Medications Prior to Admission medications   Medication Sig Start Date End Date Taking? Authorizing Provider  aspirin-sod bicarb-citric acid (ALKA-SELTZER) 325 MG TBEF tablet Take 325 mg by mouth every 6 (six) hours as needed.    [provider]  hydrochlorothiazide (HYDRODIURIL) 25 MG tablet Take 25 mg by mouth daily.    [provider]  HYDROcodone-acetaminophen (NORCO) 5-325 MG tablet Take 1-2 tablets by mouth every 6 (six) hours as needed (for eye pain). 08/01/17   Molpus, John, MD  indomethacin (INDOCIN) 25 MG capsule Take 25 mg by mouth 2 (two) times daily as needed for mild pain.     [provider]  methylPREDNISolone (MEDROL DOSEPAK) 4 MG TBPK tablet Follow package insert 01/19/20   Monterrius Cardosa, DO  ondansetron (ZOFRAN)  4 MG tablet Take 1 tablet (4 mg total) by mouth every 6 (six) hours. 03/27/16   Bethel BornGekas, Kelly Marie, PA-C    Allergies    Patient has no known allergies.  Review of Systems   Review of Systems  Constitutional: Negative for chills and fever.  HENT: Negative for ear pain and sore throat.   Eyes: Negative for pain and visual disturbance.  Respiratory: Negative for cough and shortness of breath.   Cardiovascular: Negative for chest pain and palpitations.  Gastrointestinal: Negative for abdominal pain and vomiting.  Genitourinary: Negative for dysuria and hematuria.  Musculoskeletal: Positive for arthralgias. Negative for back pain, neck pain and neck stiffness.  Skin: Negative for color change, pallor, rash and wound.  Neurological: Positive for numbness. Negative for dizziness, tremors, seizures, syncope, facial asymmetry, speech difficulty, weakness, light-headedness and headaches.  All other systems reviewed and are negative.   Physical Exam Updated Vital Signs  ED Triage Vitals  Enc Vitals Group     BP 01/19/20 1637 (!) 154/86     Pulse Rate 01/19/20 1637 (!) 53     Resp 01/19/20 1637 18     Temp 01/19/20 1637 98.6 F (37 C)     Temp src --      SpO2 01/19/20 1637 99 %     Weight 01/19/20 1636 135 lb (61.2  kg)     Height 01/19/20 1636 5\' 5"  (1.651 m)     Head Circumference --      Peak Flow --      Pain Score 01/19/20 1636 7     Pain Loc --      Pain Edu? --      Excl. in GC? --     Physical Exam Vitals and nursing note reviewed.  Constitutional:      General: He is not in acute distress.    Appearance: He is well-developed. He is not ill-appearing.  HENT:     Head: Normocephalic and atraumatic.     Nose: Nose normal.     Mouth/Throat:     Mouth: Mucous membranes are moist.  Eyes:     Extraocular Movements: Extraocular movements intact.     Conjunctiva/sclera: Conjunctivae normal.     Pupils: Pupils are equal, round, and reactive to light.  Cardiovascular:      Rate and Rhythm: Normal rate and regular rhythm.     Pulses: Normal pulses.     Heart sounds: Normal heart sounds. No murmur heard.   Pulmonary:     Effort: Pulmonary effort is normal. No respiratory distress.     Breath sounds: Normal breath sounds.  Abdominal:     Palpations: Abdomen is soft.     Tenderness: There is no abdominal tenderness.  Musculoskeletal:        General: Tenderness present. Normal range of motion.     Cervical back: Normal range of motion and neck supple. No tenderness.     Comments: Tenderness in the left trapezius area, no midline spinal tenderness, normal range of motion of the neck  Skin:    General: Skin is warm and dry.     Capillary Refill: Capillary refill takes less than 2 seconds.  Neurological:     General: No focal deficit present.     Mental Status: He is alert and oriented to person, place, and time.     Cranial Nerves: No cranial nerve deficit.     Sensory: Sensory deficit present.     Motor: No weakness.     Comments: 5+ out of 5 strength throughout, overall some patchy numbness in the left upper extremity compared to the right upper extremity appears to be mostly in the ulnar part of the left upper extremity.     ED Results / Procedures / Treatments   Labs (all labs ordered are listed, but only abnormal results are displayed) Labs Reviewed  BASIC METABOLIC PANEL - Abnormal; Notable for the following components:      Result Value   Glucose, Bld 105 (*)    All other components within normal limits  CBC WITH DIFFERENTIAL/PLATELET  TROPONIN I (HIGH SENSITIVITY)    EKG EKG Interpretation  Date/Time:  Tuesday January 19 2020 16:35:46 EDT Ventricular Rate:  51 PR Interval:  206 QRS Duration: 100 QT Interval:  398 QTC Calculation: 366 R Axis:   66 Text Interpretation: Sinus bradycardia with sinus arrhythmia Minimal voltage criteria for LVH, may be normal variant ( Sokolow-Lyon ) Borderline ECG No significant change since last tracing  Confirmed by 09-07-2004 [phone]) on 01/19/2020 4:42:04 PM   Radiology CT Head Wo Contrast  Result Date: 01/19/2020 CLINICAL DATA:  54 year old male with cervical radiculopathy. EXAM: CT HEAD WITHOUT CONTRAST CT CERVICAL SPINE WITHOUT CONTRAST TECHNIQUE: Multidetector CT imaging of the head and cervical spine was performed following the standard protocol without intravenous contrast. Multiplanar CT image reconstructions  of the cervical spine were also generated. COMPARISON:  None. FINDINGS: CT HEAD FINDINGS Brain: The ventricles and sulci appropriate size for patient's age. The gray-white matter discrimination is preserved. There is no acute intracranial hemorrhage. No mass effect or midline shift. No extra-axial fluid collection. Vascular: No hyperdense vessel or unexpected calcification. Skull: Normal. Negative for fracture or focal lesion. Sinuses/Orbits: No acute finding. Other: None CT CERVICAL SPINE FINDINGS Alignment: No acute subluxation. There is mild reversal of normal cervical lordosis which may be positional or due to muscle spasm. Skull base and vertebrae: No acute fracture. There is incomplete bony fusion of posterior ring of C1. Soft tissues and spinal canal: No prevertebral fluid or swelling. No visible canal hematoma. Disc levels: C5-C7 ACDF. Multilevel degenerative changes with spurring. Upper chest: Negative. Other: Mild bilateral carotid bulb calcified plaques. IMPRESSION: 1. Unremarkable noncontrast CT of the brain. 2. No acute/traumatic cervical spine pathology. C5-C7 ACDF. Electronically Signed   By: Elgie Collard M.D.   On: 01/19/2020 18:11   CT Cervical Spine Wo Contrast  Result Date: 01/19/2020 CLINICAL DATA:  54 year old male with cervical radiculopathy. EXAM: CT HEAD WITHOUT CONTRAST CT CERVICAL SPINE WITHOUT CONTRAST TECHNIQUE: Multidetector CT imaging of the head and cervical spine was performed following the standard protocol without intravenous contrast. Multiplanar  CT image reconstructions of the cervical spine were also generated. COMPARISON:  None. FINDINGS: CT HEAD FINDINGS Brain: The ventricles and sulci appropriate size for patient's age. The gray-white matter discrimination is preserved. There is no acute intracranial hemorrhage. No mass effect or midline shift. No extra-axial fluid collection. Vascular: No hyperdense vessel or unexpected calcification. Skull: Normal. Negative for fracture or focal lesion. Sinuses/Orbits: No acute finding. Other: None CT CERVICAL SPINE FINDINGS Alignment: No acute subluxation. There is mild reversal of normal cervical lordosis which may be positional or due to muscle spasm. Skull base and vertebrae: No acute fracture. There is incomplete bony fusion of posterior ring of C1. Soft tissues and spinal canal: No prevertebral fluid or swelling. No visible canal hematoma. Disc levels: C5-C7 ACDF. Multilevel degenerative changes with spurring. Upper chest: Negative. Other: Mild bilateral carotid bulb calcified plaques. IMPRESSION: 1. Unremarkable noncontrast CT of the brain. 2. No acute/traumatic cervical spine pathology. C5-C7 ACDF. Electronically Signed   By: Elgie Collard M.D.   On: 01/19/2020 18:11    Procedures Procedures (including critical care time)  Medications Ordered in ED Medications - No data to display  ED Course  I have reviewed the triage vital signs and the nursing notes.  Pertinent labs & imaging results that were available during my care of the patient were reviewed by me and considered in my medical decision making (see chart for details).    MDM Rules/Calculators/A&P                          Reginald Flores is a 54 year old male with history of cervical fusion who presents the ED with left arm tingling and pain.  Patient with unremarkable vitals.  No fever.  Patient with intermittent left arm paresthesias for the last several days.  Pain in the left shoulder area as well during that time.  No specific  neck pain.  No weakness in the upper extremities.  Denies any trauma.  No other strokelike symptoms.  Overall has some patchy numbness in the left upper extremity but mostly in the ulnar area throughout.  Suspect may be peripheral nerve impingement.  CT scan of the head and  neck was unremarkable.  Will prescribe Medrol Dosepak.  We will have him follow-up with his spine surgeon and given explanation to follow-up with primary care doctor and neurology as well.  No concern for stroke at this time however given return precautions.  EKG shows sinus rhythm.  Troponin normal.  Doubt cardiac process.  Lab work otherwise unremarkable.  This chart was dictated using voice recognition software.  Despite best efforts to proofread,  errors can occur which can change the documentation meaning.   Final Clinical Impression(s) / ED Diagnoses Final diagnoses:  Radiculopathy, unspecified spinal region  Left arm pain    Rx / DC Orders ED Discharge Orders         Ordered    methylPREDNISolone (MEDROL DOSEPAK) 4 MG TBPK tablet        01/19/20 1839           Virgina Norfolk, DO 01/19/20 1840

## 2020-01-19 NOTE — Discharge Instructions (Signed)
Take Medrol Dosepak as prescribed.  Take ibuprofen 400 mg as needed as well up to 3 times a day.  Follow-up with your previous spinal surgeon as well.

## 2020-01-19 NOTE — ED Triage Notes (Addendum)
Pt c/o left arm pain which radiates up arm and into shoulder and numbness x 3 days

## 2021-06-11 IMAGING — CT CT CERVICAL SPINE W/O CM
3 of 4 series · 12 of 33 positions shown, 14 images · non-contrast
Comparison: None.

CLINICAL DATA: 54-year-old male with cervical radiculopathy.

EXAM:
CT HEAD WITHOUT CONTRAST
CT CERVICAL SPINE WITHOUT CONTRAST
TECHNIQUE: Multidetector CT imaging of the head and cervical spine was
performed following the standard protocol without intravenous
contrast. Multiplanar CT image reconstructions of the cervical spine
were also generated.

[Series 5: coronals · coronal · 0.23mm/px · 3 of 61 slices shown]
[im 14/61  bone]
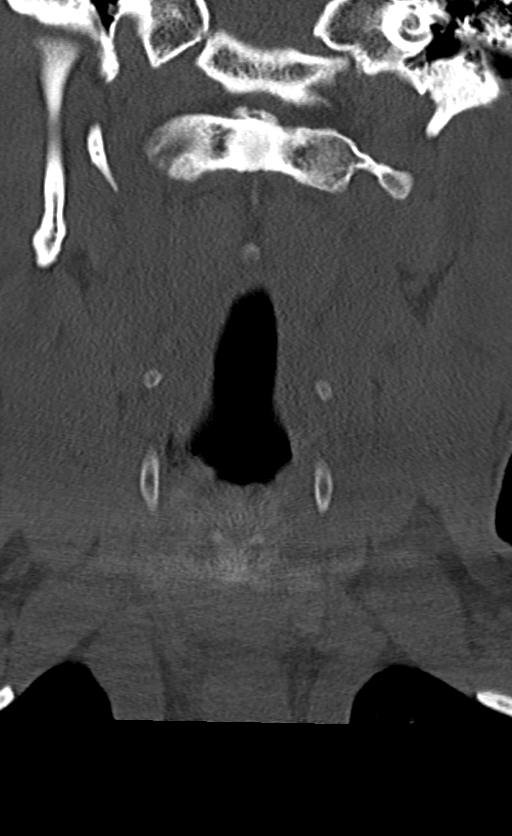
[im 25/61  bone]
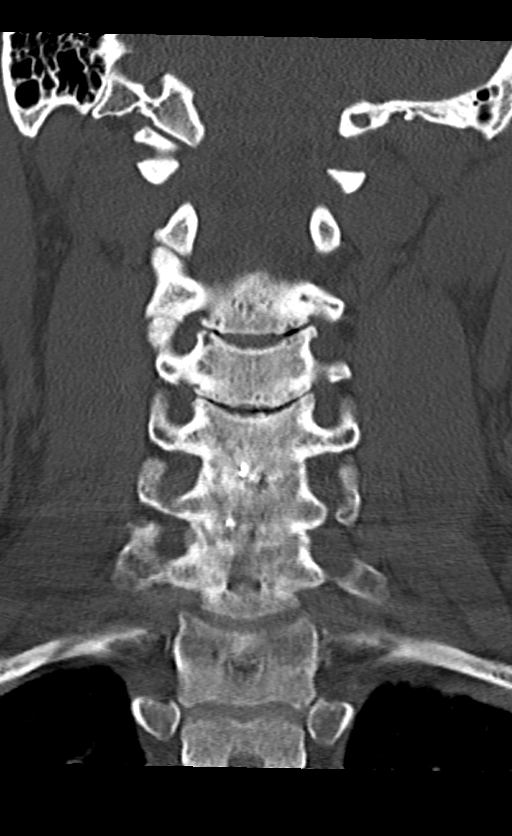
[im 36/61  bone]
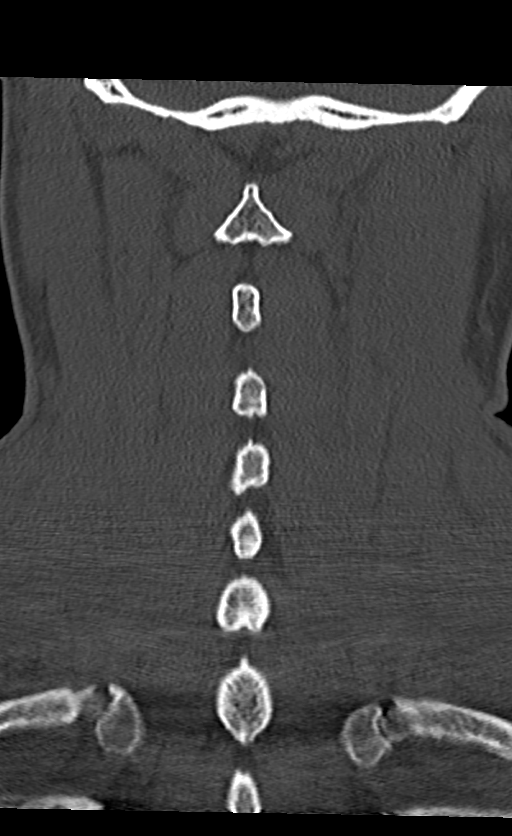

[Series 6: sagittals · sagittal · 0.25mm/px · 5 of 61 slices shown, 6 images]
[im 21/61  bone]
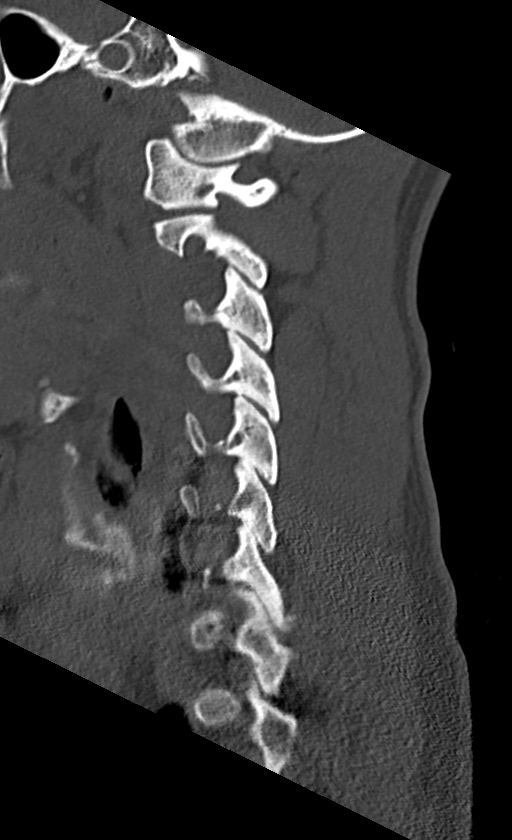
[im 26/61  bone]
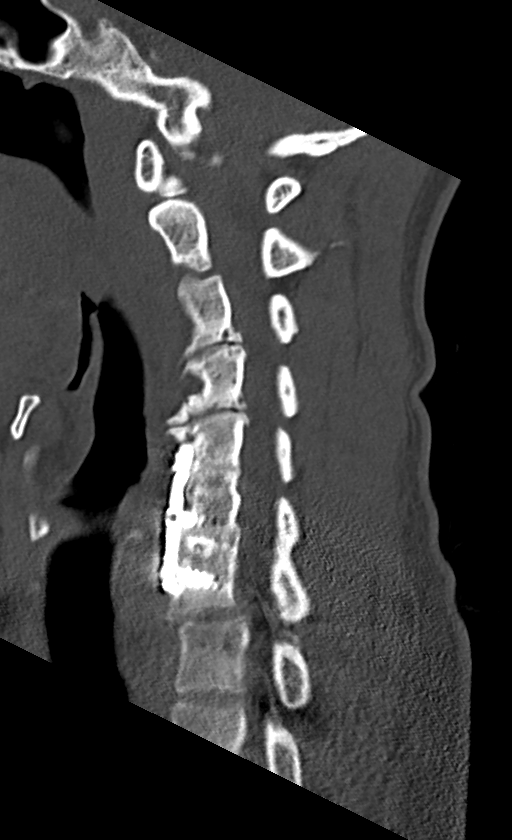
[im 31/61  soft-tissue]
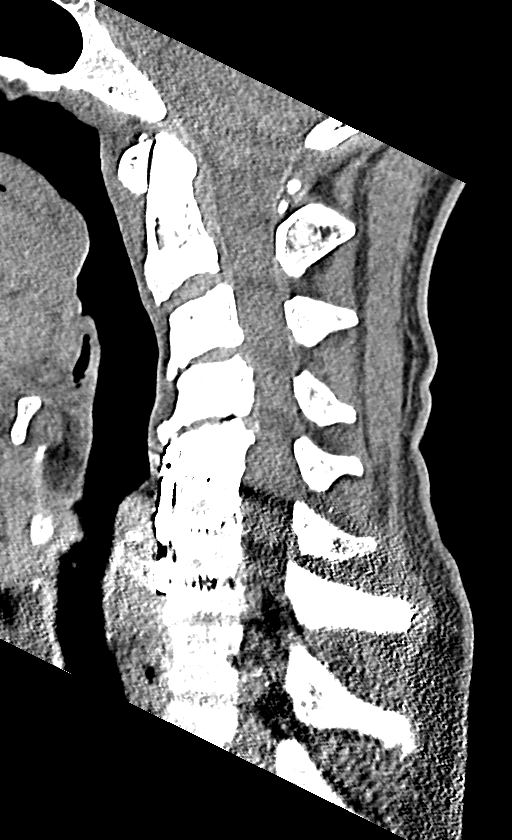
[im 31/61  bone]
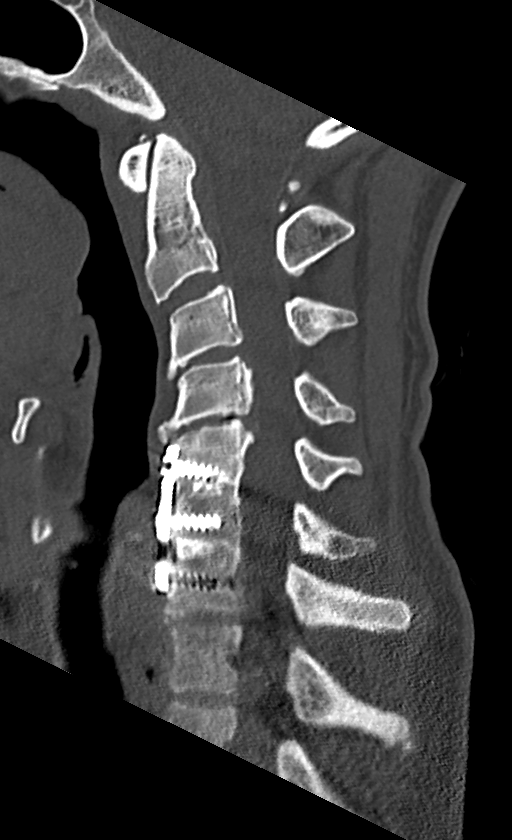
[im 36/61  bone]
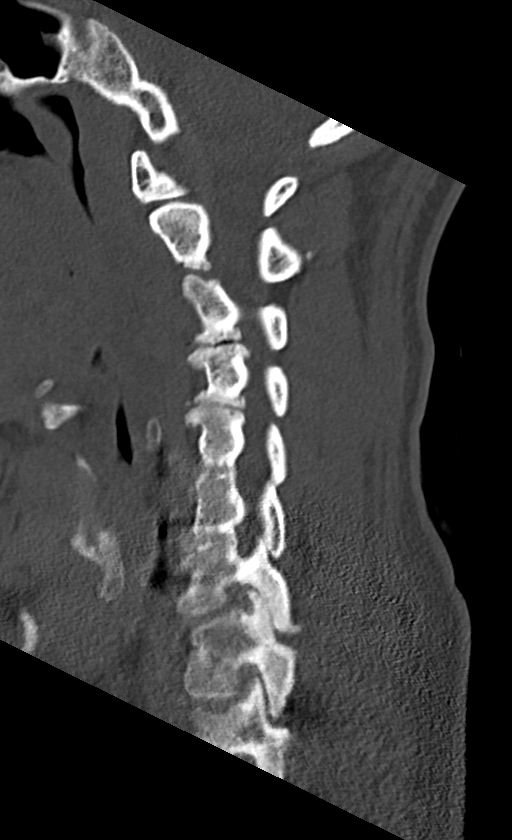
[im 41/61  bone]
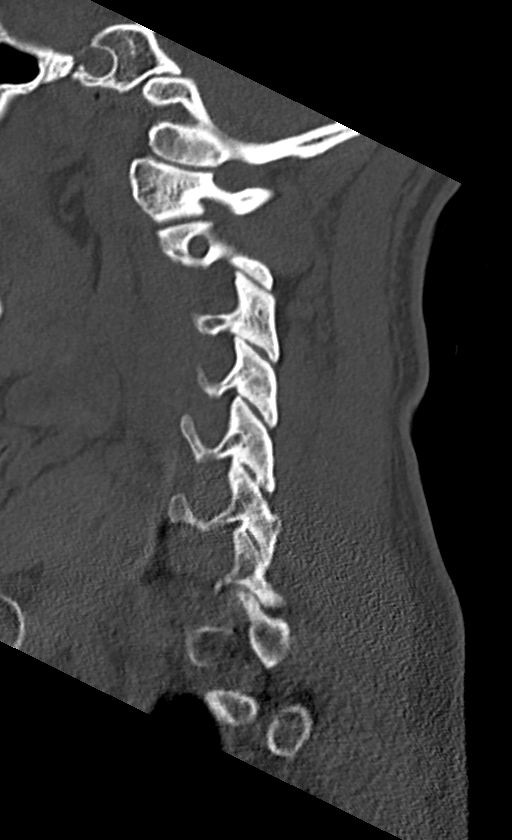

[Series 7: orthogonals · axial · 0.23mm/px · z∈[-413,-288]mm · 4 of 99 slices shown, 5 images]
[im 15/99  soft-tissue]
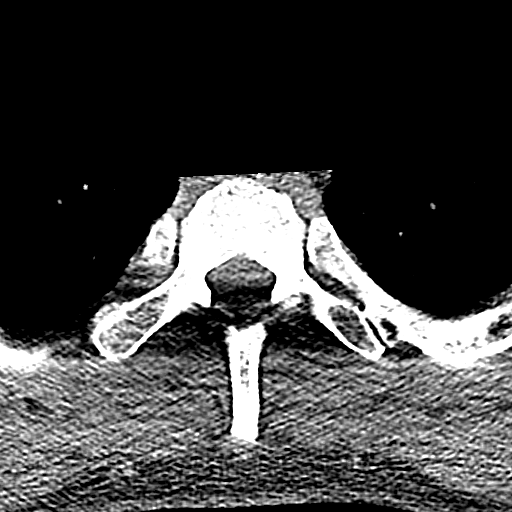
[im 15/99  bone]
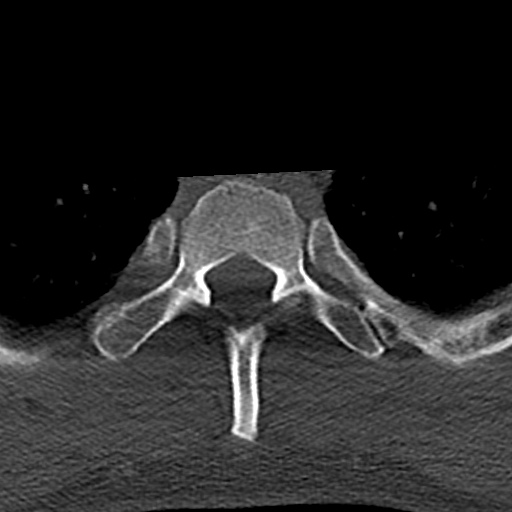
[im 43/99  bone]
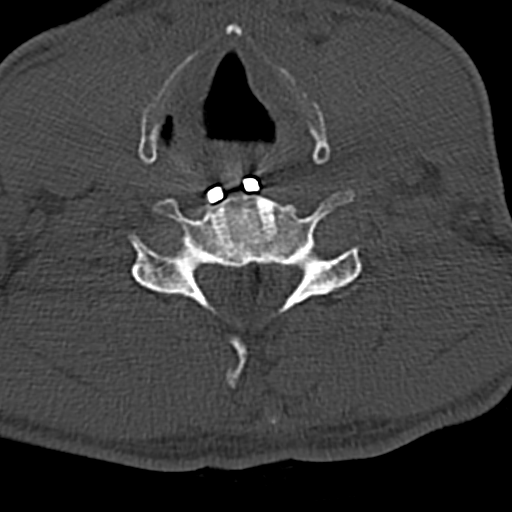
[im 57/99  bone]
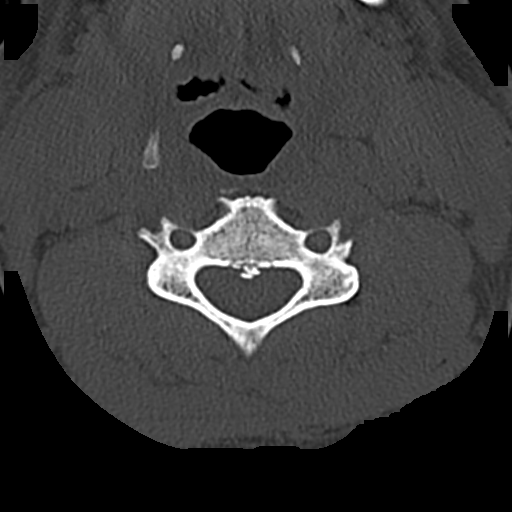
[im 85/99  bone]
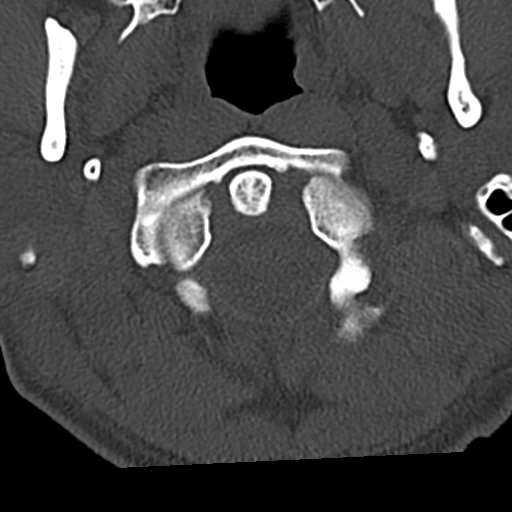

[12 of 33 positions shown; findings below may reference images not displayed]

FINDINGS: CT HEAD FINDINGS

Brain: The ventricles and sulci appropriate size for patient's age.
The gray-white matter discrimination is preserved. There is no acute
intracranial hemorrhage. No mass effect or midline shift. No
extra-axial fluid collection.

Vascular: No hyperdense vessel or unexpected calcification.

Skull: Normal. Negative for fracture or focal lesion.

Sinuses/Orbits: No acute finding.

Other: None

CT CERVICAL SPINE FINDINGS

Alignment: No acute subluxation. There is mild reversal of normal
cervical lordosis which may be positional or due to muscle spasm.

Skull base and vertebrae: No acute fracture. There is incomplete
bony fusion of posterior ring of C1.

Soft tissues and spinal canal: No prevertebral fluid or swelling. No
visible canal hematoma.

Disc levels: C5-C7 ACDF. Multilevel degenerative changes with
spurring.

Upper chest: Negative.

Other: Mild bilateral carotid bulb calcified plaques.
IMPRESSION: 1. Unremarkable noncontrast CT of the brain.
2. No acute/traumatic cervical spine pathology. C5-C7 ACDF.
